# Patient Record
Sex: Male | Born: 2000 | Race: Black or African American | Hispanic: No | Marital: Single | Smoking: Never smoker
Health system: Southern US, Community
[De-identification: ages and names within clinical notes are randomized; demographics above are authoritative.]

---
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
## 2022-02-27 ENCOUNTER — Encounter (HOSPITAL_BASED_OUTPATIENT_CLINIC_OR_DEPARTMENT_OTHER): Payer: Self-pay | Admitting: Emergency Medicine

## 2022-02-27 ENCOUNTER — Emergency Department (HOSPITAL_BASED_OUTPATIENT_CLINIC_OR_DEPARTMENT_OTHER)
Admission: EM | Admit: 2022-02-27 | Discharge: 2022-02-27 | Disposition: A | Discharge status: Home / Self Care | Payer: Medicaid Other | Attending: Emergency Medicine | Admitting: Emergency Medicine

## 2022-02-27 ENCOUNTER — Other Ambulatory Visit: Payer: Self-pay

## 2022-02-27 DIAGNOSIS — J111 Influenza due to unidentified influenza virus with other respiratory manifestations: Secondary | ICD-10-CM

## 2022-02-27 DIAGNOSIS — J101 Influenza due to other identified influenza virus with other respiratory manifestations: Secondary | ICD-10-CM | POA: Insufficient documentation

## 2022-02-27 DIAGNOSIS — Z1152 Encounter for screening for COVID-19: Secondary | ICD-10-CM | POA: Insufficient documentation

## 2022-02-27 DIAGNOSIS — R0981 Nasal congestion: Secondary | ICD-10-CM | POA: Diagnosis present

## 2022-02-27 LAB — RESP PANEL BY RT-PCR (RSV, FLU A&B, COVID)  RVPGX2
Influenza A by PCR: NEGATIVE
Influenza B by PCR: POSITIVE — AB
Resp Syncytial Virus by PCR: NEGATIVE
SARS Coronavirus 2 by RT PCR: NEGATIVE

## 2022-02-27 NOTE — ED Notes (Signed)
L pec been hurting since before sickness, while bench pressing. No pop or tearing sensation, but pain to isolated pectoral region. Pain with ROM.

## 2022-02-27 NOTE — Discharge Instructions (Signed)
You were seen in the emergency department today for congestion and cough.  You have the flu.  Please continue drink plenty of fluids and rest.  Please use Tylenol or ibuprofen for body aches, fever.  You may use over-the-counter cough and cold medications.  Please return to the emergency department for significantly worsening symptoms or shortness of breath with fever concerning for pneumonia.

## 2022-02-27 NOTE — ED Triage Notes (Signed)
Pt reports runny nose, cough since last week

## 2022-02-27 NOTE — ED Provider Notes (Cosign Needed)
[facility] EMERGENCY DEPARTMENT Provider Note   CSN: [id number] Arrival date & time: 02/27/22  1549     History  Chief Complaint  Patient presents with   Nasal Congestion    Barry Duran is a 21 y.o. male.  With no significant past medical history presents to the emergency department with congestion.  Patient states that symptoms began on Tuesday.  He describes having nonproductive cough, nasal congestion with rhinorrhea, headache.  He states his symptoms have persisted over the past week.  He denies having any fevers, chills, nausea, vomiting, diarrhea, decreased appetite.  He has been tolerating p.o. at home.  He denies having any sore throat or difficulty breathing or shortness of breath.  HPI     Home Medications Prior to Admission medications   Medication Sig Start Date End Date Taking? Authorizing Provider  cyclobenzaprine (FLEXERIL) 10 MG tablet Take 1 tablet (10 mg total) by mouth 2 (two) times daily as needed for muscle spasms. 01/11/21   Theron Arista, PA-C      Allergies    Patient has no known allergies.    Review of Systems   Review of Systems  HENT:  Positive for congestion and rhinorrhea. Negative for sore throat.   Respiratory:  Positive for cough. Negative for shortness of breath.   All other systems reviewed and are negative.   Physical Exam Updated Vital Signs BP 139/85 (BP Location: Right Arm)   Pulse 92   Temp 98.6 F (37 C) (Oral)   Resp 16   Ht 6\' 1"  (1.854 m)   Wt 108.9 kg   SpO2 98%   BMI 31.66 kg/m  Physical Exam Vitals and nursing note reviewed.  Constitutional:      General: He is not in acute distress.    Appearance: Normal appearance. He is ill-appearing. He is not toxic-appearing.  HENT:     Head: Normocephalic.     Nose: Congestion present.     Mouth/Throat:     Mouth: Mucous membranes are moist.     Pharynx: Oropharynx is clear. Posterior oropharyngeal erythema present.  Eyes:     General: No scleral icterus.     Extraocular Movements: Extraocular movements intact.  Cardiovascular:     Rate and Rhythm: Normal rate and regular rhythm.     Pulses: Normal pulses.     Heart sounds: No murmur heard. Pulmonary:     Effort: Pulmonary effort is normal. No respiratory distress.     Breath sounds: Normal breath sounds. No wheezing, rhonchi or rales.  Musculoskeletal:     Cervical back: Neck supple.  Skin:    General: Skin is warm and dry.     Capillary Refill: Capillary refill takes less than 2 seconds.  Neurological:     General: No focal deficit present.     Mental Status: He is alert and oriented to person, place, and time.  Psychiatric:        Mood and Affect: Mood normal.        Behavior: Behavior normal.     ED Results / Procedures / Treatments   Labs (all labs ordered are listed, but only abnormal results are displayed) Labs Reviewed  RESP PANEL BY RT-PCR (RSV, FLU A&B, COVID)  RVPGX2 - Abnormal; Notable for the following components:      Result Value   Influenza B by PCR POSITIVE (*)    All other components within normal limits    EKG None  Radiology No results found.  Procedures Procedures  Medications Ordered in ED Medications - No data to display  ED Course/ Medical Decision Making/ A&P                           Medical Decision Making Initial Impression and Ddx 22 year old male who presents to the emergency department with cough and congestion.  He is ill-appearing but overall nonseptic, nontoxic in appearance.  Hemodynamically stable.  His lungs are clear bilaterally.  Symptoms concerning for viral infection.  Will order respiratory panel. Patient PMH that increases complexity of ED encounter: None  Interpretation of Diagnostics I independent reviewed and interpreted the labs as followed: Flu B positive  - I independently visualized the following imaging with scope of interpretation limited to determining acute life threatening conditions related to emergency care:  Not indicated  Patient Reassessment and Ultimate Disposition/Management Respiratory panel with flu b positive. Lungs are clear bilaterally so will not order chest x-ray at this time. He is hemodynamically stable without evidence of sepsis.  Do not feel that he needs laboratory workup at this time. Oropharynx with erythema without tonsillar exudates or swelling or concern for strep, PTA, RPA, Ludwig angina. Will discharge him with symptomatic treatment.  Given return precautions for shortness of breath or worsening symptoms concerning for symptoms.  He verbalized understanding.  The patient has been appropriately medically screened and/or stabilized in the ED. I have low suspicion for any other emergent medical condition which would require further screening, evaluation or treatment in the ED or require inpatient management. At time of discharge the patient is hemodynamically stable and in no acute distress. I have discussed work-up results and diagnosis with patient and answered all questions. Patient is agreeable with discharge plan. We discussed strict return precautions for returning to the emergency department and they verbalized understanding.     Patient management required discussion with the following services or consulting groups:  None  Complexity of Problems Addressed Acute uncomplicated illness or injury with no diagnostics  Additional Data Reviewed and Analyzed Further history obtained from: Further history from spouse/family member and Care Everywhere  Patient Encounter Risk Assessment None  Final Clinical Impression(s) / ED Diagnoses Final diagnoses:  Influenza    Rx / DC Orders ED Discharge Orders     None         Cristopher Peru, PA-C 02/27/22 1732

## 2022-02-27 NOTE — ED Notes (Signed)
Sick since Tuesday, 5 days. Runny nose, sore throat, CP earlier but resolved. Cough. No n/v/d. No fevers.  Taken Mucinex, took Tylenol and Ibuprofen.

## 2022-03-31 ENCOUNTER — Emergency Department (HOSPITAL_BASED_OUTPATIENT_CLINIC_OR_DEPARTMENT_OTHER)
Admission: EM | Admit: 2022-03-31 | Discharge: 2022-03-31 | Disposition: A | Discharge status: Home / Self Care | Payer: Medicaid Other | Attending: Emergency Medicine | Admitting: Emergency Medicine

## 2022-03-31 ENCOUNTER — Other Ambulatory Visit: Payer: Self-pay

## 2022-03-31 DIAGNOSIS — R220 Localized swelling, mass and lump, head: Secondary | ICD-10-CM | POA: Diagnosis present

## 2022-03-31 DIAGNOSIS — L723 Sebaceous cyst: Secondary | ICD-10-CM | POA: Diagnosis not present

## 2022-03-31 DIAGNOSIS — L988 Other specified disorders of the skin and subcutaneous tissue: Secondary | ICD-10-CM | POA: Diagnosis not present

## 2022-03-31 MED ORDER — DOXYCYCLINE HYCLATE 100 MG PO CAPS: 100.0000 mg | ORAL_CAPSULE | Freq: Two times a day (BID) | ORAL | 0 refills | Status: AC

## 2022-03-31 MED ORDER — IBUPROFEN 800 MG PO TABS
800.0000 mg | ORAL_TABLET | Freq: Once | ORAL | Status: AC
Start: 2022-03-31 — End: 2022-03-31
  Administered 2022-03-31: 800 mg via ORAL
  Filled 2022-03-31: qty 1

## 2022-03-31 NOTE — ED Triage Notes (Signed)
Pt c/o "a bump" in L nare x2 days.  Redness and slight swelling noted.  Pain score 6/10.

## 2022-03-31 NOTE — Discharge Instructions (Signed)
Please use warm compresses, in your nose to help the area come to ahead,.  Make sure you are taking ibuprofen for pain control, take the antibiotics as prescribed.  If you have redness, swelling that spreads, or is getting worse please return to the ER.

## 2022-03-31 NOTE — ED Provider Notes (Signed)
[facility] Provider Note   CSN: [id number] Arrival date & time: 03/31/22  1215     History  Chief Complaint  Patient presents with   Facial Swelling    Harrold Ingwersen is a 22 y.o. male, no pertinent past medical history, who presents to the ED secondary to swelling of his left nare for the last couple days.  Mother states that patient has had some redness, swelling to the area, and then developed a fever in the past day, she states that she has not taken his temperature, but that it has been elevated and he has been sweating.  Denies any sinus pain, tooth pain, and increased congestion, ear pain.  Has otherwise been doing well.  Denies any trauma to the area.  States that the bump popped out of nowhere, and has been extremely painful and swollen since then.     Home Medications Prior to Admission medications   Medication Sig Start Date End Date Taking? Authorizing Provider  doxycycline (VIBRAMYCIN) 100 MG capsule Take 1 capsule (100 mg total) by mouth 2 (two) times daily. 03/31/22  Yes Asir Bingley L, PA  cyclobenzaprine (FLEXERIL) 10 MG tablet Take 1 tablet (10 mg total) by mouth 2 (two) times daily as needed for muscle spasms. 01/11/21   Sherrill Raring, PA-C      Allergies    Patient has no known allergies.    Review of Systems   Review of Systems  HENT:  Negative for ear pain and sinus pain.   Skin:  Positive for rash.    Physical Exam Updated Vital Signs BP 124/85 (BP Location: Left Arm)   Pulse (!) 107   Temp 100.1 F (37.8 C) (Oral)   Resp 19   Ht 6\' 1"  (1.854 m)   Wt 106.6 kg   SpO2 98%   BMI 31.00 kg/m  Physical Exam Vitals and nursing note reviewed.  Constitutional:      General: He is not in acute distress.    Appearance: He is well-developed.  HENT:     Head: Normocephalic and atraumatic.  Eyes:     Conjunctiva/sclera: Conjunctivae normal.  Cardiovascular:     Rate and Rhythm: Normal rate and regular  rhythm.     Heart sounds: No murmur heard. Pulmonary:     Effort: Pulmonary effort is normal. No respiratory distress.     Breath sounds: Normal breath sounds.  Abdominal:     Palpations: Abdomen is soft.     Tenderness: There is no abdominal tenderness.  Musculoskeletal:        General: No swelling.     Cervical back: Neck supple.  Skin:    General: Skin is warm and dry.     Capillary Refill: Capillary refill takes less than 2 seconds.     Comments: +0.8 cm nodule to entrance of L nare w/erythema, no fluctuance  Neurological:     Mental Status: He is alert.  Psychiatric:        Mood and Affect: Mood normal.     ED Results / Procedures / Treatments   Labs (all labs ordered are listed, but only abnormal results are displayed) Labs Reviewed - No data to display  EKG None  Radiology No results found.  Procedures Procedures   Medications Ordered in ED Medications  ibuprofen (ADVIL) tablet 800 mg (has no administration in time range)    ED Course/ Medical Decision Making/ A&P  Medical Decision Making Patient is a 22 year old male, here for nodule on his left nare, has been going on for couple days, with associated fever.  He has no sinus pain, ear pain, dental pain.  Just has a nodule to the left nare that is red and swollen, with his fever, concern for possible developing infection, we will start him on doxycycline, and give him strict return precautions.  We deferred incision and drainage given that there is no fluctuance of the lesion, and also per patient preference.  Discussed return precautions patient voiced understanding.  Provided with work note.  Risk Prescription drug management.    Final Clinical Impression(s) / ED Diagnoses Final diagnoses:  Inflamed sebaceous cyst    Rx / DC Orders ED Discharge Orders          Ordered    doxycycline (VIBRAMYCIN) 100 MG capsule  2 times daily        03/31/22 1321               Kennice Finnie, [place], [place] 03/31/22 1328    Hayden Rasmussen, MD 03/31/22 1721

## 2022-03-31 NOTE — ED Notes (Signed)
Pt verbalized understanding of discharge instructions. Opportunity for questions provided.   

## 2022-05-21 ENCOUNTER — Other Ambulatory Visit: Payer: Self-pay

## 2022-05-21 ENCOUNTER — Emergency Department (HOSPITAL_BASED_OUTPATIENT_CLINIC_OR_DEPARTMENT_OTHER)
Admission: EM | Admit: 2022-05-21 | Discharge: 2022-05-21 | Disposition: A | Discharge status: Home / Self Care | Payer: Medicaid Other | Attending: Emergency Medicine | Admitting: Emergency Medicine

## 2022-05-21 ENCOUNTER — Encounter (HOSPITAL_BASED_OUTPATIENT_CLINIC_OR_DEPARTMENT_OTHER): Payer: Self-pay | Admitting: Emergency Medicine

## 2022-05-21 ENCOUNTER — Emergency Department (HOSPITAL_BASED_OUTPATIENT_CLINIC_OR_DEPARTMENT_OTHER): Payer: Medicaid Other

## 2022-05-21 DIAGNOSIS — Y9367 Activity, basketball: Secondary | ICD-10-CM | POA: Diagnosis not present

## 2022-05-21 DIAGNOSIS — S93401A Sprain of unspecified ligament of right ankle, initial encounter: Secondary | ICD-10-CM | POA: Diagnosis not present

## 2022-05-21 DIAGNOSIS — W2105XA Struck by basketball, initial encounter: Secondary | ICD-10-CM | POA: Insufficient documentation

## 2022-05-21 DIAGNOSIS — S99911A Unspecified injury of right ankle, initial encounter: Secondary | ICD-10-CM | POA: Diagnosis present

## 2022-05-21 MED ORDER — IBUPROFEN 400 MG PO TABS
600.0000 mg | ORAL_TABLET | Freq: Once | ORAL | Status: AC
  Administered 2022-05-21: 600 mg via ORAL
  Filled 2022-05-21: qty 1

## 2022-05-21 MED ORDER — IBUPROFEN 600 MG PO TABS
600.0000 mg | ORAL_TABLET | Freq: Four times a day (QID) | ORAL | 0 refills | Status: AC | PRN
Start: 2022-05-21 — End: ?

## 2022-05-21 NOTE — Discharge Instructions (Addendum)
You were evaluated today for a right ankle injury.  Your x-rays show no signs of fracture.  You likely have a bad sprain.  You are given a boot and crutches to help decrease the stress on your ankle.  You can put weight on it as tolerated, use ibuprofen as needed for pain, keep the ankle elevated when at rest to help with the swelling as well.  Use ice or heat to help with pain also.  Come back to the ER for any new or worsening symptoms.

## 2022-05-21 NOTE — ED Provider Notes (Signed)
[facility] Provider Note   CSN: [id number] Arrival date & time: 05/21/22  1717     History  Chief Complaint  Patient presents with   Ankle Pain    Barry Duran is a 22 y.o. male.  Denies any chronic medical conditions.  Presents the ER complaining of right ankle pain and swelling that started last night.  He reports he was playing basketball and went up for a lay up, when he came down he landed on his foot with his right the ankle, not sure which way it twisted and he fell to the ground.  Denies leg or knee pain.  He was able to walk on it last night and was walking around throughout the day today but the pain became progressively worse to where he cannot tolerate more weightbearing and the swelling also got worse.  He denies any numbness or tingling.  Denies other injuries from the fall   Ankle Pain      Home Medications Prior to Admission medications   Medication Sig Start Date End Date Taking? Authorizing Provider  ibuprofen (ADVIL) 600 MG tablet Take 1 tablet (600 mg total) by mouth every 6 (six) hours as needed. 05/21/22  Yes Emberlee Sortino A, PA-C  cyclobenzaprine (FLEXERIL) 10 MG tablet Take 1 tablet (10 mg total) by mouth 2 (two) times daily as needed for muscle spasms. 01/11/21   Sherrill Raring, PA-C  doxycycline (VIBRAMYCIN) 100 MG capsule Take 1 capsule (100 mg total) by mouth 2 (two) times daily. 03/31/22   Small, Brooke L, PA      Allergies    Patient has no known allergies.    Review of Systems   Review of Systems  Physical Exam Updated Vital Signs BP (!) 130/90 (BP Location: Right Arm)   Pulse 65   Temp 98.7 F (37.1 C) (Oral)   Resp 14   Ht 6\' 1"  (1.854 m)   Wt 106.1 kg   SpO2 97%   BMI 30.87 kg/m  Physical Exam Vitals and nursing note reviewed.  Constitutional:      General: He is not in acute distress.    Appearance: He is well-developed.  HENT:     Head: Normocephalic and atraumatic.  Eyes:      Conjunctiva/sclera: Conjunctivae normal.  Cardiovascular:     Rate and Rhythm: Normal rate and regular rhythm.     Heart sounds: No murmur heard. Pulmonary:     Effort: Pulmonary effort is normal. No respiratory distress.     Breath sounds: Normal breath sounds.  Abdominal:     Palpations: Abdomen is soft.     Tenderness: There is no abdominal tenderness.  Musculoskeletal:        General: No swelling.     Cervical back: Neck supple.     Comments: Right ankle has moderate swelling over the medial malleolus with some mild ecchymosis.  DP and PT pulses are 2+ in the foot.  There is no tenderness over the lateral malleolus, no tenderness over Achilles tendon, normal plantarflexion with calf squeeze, normal sensation throughout the foot.  No tenderness over the leg more proximally or the knee specifically no tenderness over the fibular head.  Skin:    General: Skin is warm and dry.     Capillary Refill: Capillary refill takes less than 2 seconds.  Neurological:     General: No focal deficit present.     Mental Status: He is alert and oriented to person, place,  and time.  Psychiatric:        Mood and Affect: Mood normal.     ED Results / Procedures / Treatments   Labs (all labs ordered are listed, but only abnormal results are displayed) Labs Reviewed - No data to display  EKG None  Radiology DG Ankle Complete Right  Result Date: 05/21/2022 CLINICAL DATA:  Fall with swelling. Ankle pain after rolling his ankle playing basketball last night. EXAM: RIGHT ANKLE - COMPLETE 3+ VIEW COMPARISON:  None Available. FINDINGS: There is no evidence of fracture, dislocation, or joint effusion. There is no evidence of arthropathy or other focal bone abnormality. Soft tissue swelling about the ankle. IMPRESSION: 1. No acute fracture or dislocation. 2. Soft tissue swelling about the ankle. Electronically Signed   By: Keane Police D.O.   On: 05/21/2022 18:14    Procedures Procedures    Medications  Ordered in ED Medications  ibuprofen (ADVIL) tablet 600 mg (has no administration in time range)    ED Course/ Medical Decision Making/ A&P                             Medical Decision Making Differential diagnosis: Fracture, sprain, strain, dislocation, other  Imaging: X-ray left ankle shows no fracture or dislocation my interpretation, radiology read also appreciated and agreement with this  ED course: Reporting pain and swelling to the left ankle after twisting the ankle last night.  X-rays are unremarkable.  Discussed with patient this is likely a sprain, advised on walking boot and crutches, limit weightbearing, discussed follow-up with orthopedics versus sports medicine in this building and he states it would be convenient for him to follow-up here so he is given that information.  Advised on ibuprofen for pain, elevation and strict return precautions.  Amount and/or Complexity of Data Reviewed Radiology: ordered.           Final Clinical Impression(s) / ED Diagnoses Final diagnoses:  Sprain of right ankle, unspecified ligament, initial encounter    Rx / DC Orders ED Discharge Orders          Ordered    ibuprofen (ADVIL) 600 MG tablet  Every 6 hours PRN        05/21/22 1830              Darci Current 05/21/22 Lenell Antu, MD 05/21/22 [phone]

## 2022-05-21 NOTE — ED Notes (Signed)
ED Provider at bedside. 

## 2022-05-21 NOTE — ED Triage Notes (Signed)
Pt via POV c/o right ankle pain and swelling after rolling his ankle playing basketball last night. Ankle is obviously swollen, no deformity noted. Limited ROM.

## 2022-06-03 ENCOUNTER — Ambulatory Visit (INDEPENDENT_AMBULATORY_CARE_PROVIDER_SITE_OTHER): Payer: Medicaid Other | Admitting: Family Medicine

## 2022-06-03 VITALS — BP 124/68 | Ht 73.0 in | Wt 228.0 lb

## 2022-06-03 DIAGNOSIS — S93491A Sprain of other ligament of right ankle, initial encounter: Secondary | ICD-10-CM | POA: Diagnosis present

## 2022-06-03 DIAGNOSIS — S93401A Sprain of unspecified ligament of right ankle, initial encounter: Secondary | ICD-10-CM | POA: Insufficient documentation

## 2022-06-03 NOTE — Progress Notes (Signed)
  Lancer Grzesik - 22 y.o. male MRN [id number]  Date of birth: 02-01-2001  SUBJECTIVE:  Including CC & ROS.  No chief complaint on file.   Rayden Cauthon is a 22 y.o. male that is presenting with right ankle pain.  He had an ankle inversion injury when playing basketball.  He landed on another person's foot.  Has a history of ankle injury.  The pain has improved.  Having some swelling.  Review of the emergency department note from 3/22 shows he was provided ibuprofen Independent review of the right ankle x-ray from 3/22 shows no acute bony changes  Review of Systems See HPI   HISTORY: Past Medical, Surgical, Social, and Family History Reviewed & Updated per EMR.   Pertinent Historical Findings include:  No past medical history on file.  No past surgical history on file.   PHYSICAL EXAM:  VS: BP 124/68   Ht 6\' 1"  (1.854 m)   Wt 228 lb (103.4 kg)   BMI 30.08 kg/m  Physical Exam Gen: NAD, alert, cooperative with exam, well-appearing MSK:  Neurovascularly intact       ASSESSMENT & PLAN:   Sprain of right ankle Acutely occurring.  Has good range of motion and no instability. -Counseled on home exercise therapy and supportive care. -Green sport insoles. -Counseled on compression. -Could consider physical therapy

## 2022-06-03 NOTE — Patient Instructions (Signed)
Nice to meet you Please use ice as needed  Please try the ankle exercises  Please use the insoles  You can consider the compression   Please send me a message in MyChart with any questions or updates.  Please see me back in 4 weeks or as needed if better.   --Dr. Raeford Razor

## 2022-06-03 NOTE — Assessment & Plan Note (Signed)
Acutely occurring.  Has good range of motion and no instability. -Counseled on home exercise therapy and supportive care. -Green sport insoles. -Counseled on compression. -Could consider physical therapy

## 2022-06-15 ENCOUNTER — Encounter: Payer: Self-pay | Admitting: *Deleted
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
